# Patient Record
Sex: Female | Born: 1965 | Race: White | Hispanic: No | Marital: Married
Health system: Southern US, Community
[De-identification: ages and names within clinical notes are randomized; demographics above are authoritative.]

---

## 2001-01-19 ENCOUNTER — Encounter: Admission: RE | Admit: 2001-01-19 | Discharge: 2001-01-19 | Payer: Self-pay | Admitting: Urology

## 2001-01-19 ENCOUNTER — Encounter: Payer: Self-pay | Admitting: Urology

## 2001-10-19 ENCOUNTER — Ambulatory Visit (HOSPITAL_BASED_OUTPATIENT_CLINIC_OR_DEPARTMENT_OTHER): Admission: RE | Admit: 2001-10-19 | Discharge: 2001-10-19 | Payer: Self-pay | Admitting: Urology

## 2001-10-19 ENCOUNTER — Encounter (INDEPENDENT_AMBULATORY_CARE_PROVIDER_SITE_OTHER): Payer: Self-pay | Admitting: *Deleted

## 2003-05-23 ENCOUNTER — Other Ambulatory Visit: Admission: RE | Admit: 2003-05-23 | Discharge: 2003-05-23 | Payer: Self-pay | Admitting: Family Medicine

## 2004-03-16 ENCOUNTER — Encounter: Admission: RE | Admit: 2004-03-16 | Discharge: 2004-03-16 | Payer: Self-pay | Admitting: Family Medicine

## 2004-05-26 ENCOUNTER — Other Ambulatory Visit: Admission: RE | Admit: 2004-05-26 | Discharge: 2004-05-26 | Payer: Self-pay | Admitting: Family Medicine

## 2005-03-29 ENCOUNTER — Encounter: Admission: RE | Admit: 2005-03-29 | Discharge: 2005-03-29 | Payer: Self-pay | Admitting: Family Medicine

## 2005-05-27 ENCOUNTER — Other Ambulatory Visit: Admission: RE | Admit: 2005-05-27 | Discharge: 2005-05-27 | Payer: Self-pay | Admitting: Family Medicine

## 2006-03-30 ENCOUNTER — Encounter: Admission: RE | Admit: 2006-03-30 | Discharge: 2006-03-30 | Payer: Self-pay | Admitting: Family Medicine

## 2006-06-06 ENCOUNTER — Other Ambulatory Visit: Admission: RE | Admit: 2006-06-06 | Discharge: 2006-06-06 | Payer: Self-pay | Admitting: Family Medicine

## 2006-07-21 ENCOUNTER — Encounter (INDEPENDENT_AMBULATORY_CARE_PROVIDER_SITE_OTHER): Payer: Self-pay | Admitting: Obstetrics and Gynecology

## 2006-07-21 ENCOUNTER — Ambulatory Visit (HOSPITAL_COMMUNITY): Admission: RE | Admit: 2006-07-21 | Discharge: 2006-07-21 | Payer: Self-pay | Admitting: Obstetrics and Gynecology

## 2007-04-03 ENCOUNTER — Encounter: Admission: RE | Admit: 2007-04-03 | Discharge: 2007-04-03 | Payer: Self-pay | Admitting: Family Medicine

## 2007-06-07 ENCOUNTER — Other Ambulatory Visit: Admission: RE | Admit: 2007-06-07 | Discharge: 2007-06-07 | Payer: Self-pay | Admitting: Family Medicine

## 2008-04-11 ENCOUNTER — Encounter: Admission: RE | Admit: 2008-04-11 | Discharge: 2008-04-11 | Payer: Self-pay | Admitting: Family Medicine

## 2008-06-06 ENCOUNTER — Other Ambulatory Visit: Admission: RE | Admit: 2008-06-06 | Discharge: 2008-06-06 | Payer: Self-pay | Admitting: Family Medicine

## 2009-04-14 ENCOUNTER — Encounter: Admission: RE | Admit: 2009-04-14 | Discharge: 2009-04-14 | Payer: Self-pay | Admitting: Family Medicine

## 2009-06-18 ENCOUNTER — Other Ambulatory Visit: Admission: RE | Admit: 2009-06-18 | Discharge: 2009-06-18 | Payer: Self-pay | Admitting: Family Medicine

## 2010-04-13 ENCOUNTER — Other Ambulatory Visit: Payer: Self-pay | Admitting: Family Medicine

## 2010-04-13 DIAGNOSIS — Z1231 Encounter for screening mammogram for malignant neoplasm of breast: Secondary | ICD-10-CM

## 2010-04-20 ENCOUNTER — Ambulatory Visit
Admission: RE | Admit: 2010-04-20 | Discharge: 2010-04-20 | Disposition: A | Discharge status: Home / Self Care | Payer: BC Managed Care – PPO | Source: Ambulatory Visit | Attending: Family Medicine | Admitting: Family Medicine

## 2010-04-20 DIAGNOSIS — Z1231 Encounter for screening mammogram for malignant neoplasm of breast: Secondary | ICD-10-CM

## 2010-06-23 NOTE — Op Note (Signed)
[name], HUMBARGER              ACCOUNT NO.:  [id number]   MEDICAL RECORD NO.:  [id number]          PATIENT TYPE:  AMB   LOCATION:  [facility]                           FACILITY:  [facility]   PHYSICIAN:  Gerald Leitz, MD          DATE OF BIRTH:  12/25/1965   DATE OF PROCEDURE:  07/21/2006  DATE OF DISCHARGE:                               OPERATIVE REPORT   PREOPERATIVE DIAGNOSIS:  1. Desires permanent sterilization.  2. Menorrhagia.  3. Uterine fibroids.   POSTOPERATIVE DIAGNOSIS:  1. Desires permanent sterilization.  2. Menorrhagia.  3. Uterine fibroids.   PROCEDURE:  Dilation and curettage, ThermaChoice endometrial ablation,  and laparoscopic bilateral tubal ligation with Filshie clamps.   SURGEON:  Dr. Gerald Leitz   ASSISTANT:  None.   ANESTHESIA:  General.   SPECIMEN:  Endometrial curettings.   DISPOSITION:  No specimen sent to pathology.   ESTIMATED BLOOD LOSS:  Approximately 10 mL.   COMPLICATIONS:  None.   INDICATIONS:  This is a 45 year old gravida 0 who desires permanent  sterilization.  She also has a history of menorrhagia and desires  endometrial ablation.   PROCEDURE:  Informed consent was obtained and the patient was taken to  the operating room where she was placed under general anesthesia. She  was placed in dorsal lithotomy position.  She was prepped and draped in  the usual sterile fashion. In-and-out catheterization was performed with  200 mL of urine obtained.  Bivalve speculum placed into the vaginal  vault.  The anterior lip of the cervix was grasped with a single-tooth  tenaculum.  The cervix sounded to 8 cm. The cervix dilated to #12  dilator.  Sharp curettage was performed all way around until a gritty  texture was noted. Endometrial curettings were sent to pathology.  ThermaChoice ablation was performed.  The apparatus was inserted to a  depth of 8 mm, approximately 10 mL of D5W was inserted into the  ThermaChoice balloon until pressure was obtained  greater than 160.  ThermaChoice ablation proceeded. This ablation discontinued due to  overheating to 90 degrees Celsius. After 6 minutes of therapy the  apparatus was allowed to cool and the fluid of D5W was removed from the  balloon and the ThermaChoice apparatus was removed from the cervix. The  single-tooth tenaculum was removed from the anterior lip of the cervix.  Uterine manipulator was then placed.  The attention was turned to the  abdomen where a 5 mm incision was made at the umbilicus. 5 mm Excel  trocar was placed under direct visualization. Pneumoperitoneum was  obtained with CO2 gas.  The abdomen and pelvis were examined.  The  patient was noted to have numerous pedunculated fibroids and ovaries  appeared normal bilaterally. The fallopian tube on the right had a  clubbed dilated appearance.  10 mm incision was made 2 cm above the  pubic symphysis, 10 mm Excel trocar was introduced under direct  visualization. Filshie clip apparatus was inserted and Filshie clips  were placed on the right and left fallopian tube. There was some slight  oozing  from a pedunculated fibroid in the left adnexa. Suction  irrigation was performed.  This was then found to be hemostatic. 10 mm  trocar was removed from the abdomen under direct visualization.  CO2  pneumoperitoneum was released and the umbilical trocar was removed under  direct visualization. The 10 mm incision in the fascia was closed with 0  Vicryl.  Skin was closed with 4-0 Vicryl.  The umbilical skin incision  was closed with 4-0 Vicryl.  Sponge, lap, needle counts were correct x2.  The uterine manipulator was removed from the vagina.   FINDINGS:  Multiple uterine fibroids, small pedunculated fibroids, as  well as a larger a pedunculated fibroid in the left adnexa. Ovaries  appeared normal bilaterally.  The right fallopian tube was somewhat  dilated with clubbed appearance.      Gerald Leitz, MD  Electronically Signed      TC/MEDQ  D:  07/21/2006  T:  07/21/2006  Job:  [id number]

## 2010-06-23 NOTE — H&P (Signed)
NAMESHARANDA, [name]              ACCOUNT NO.:  [id number]   MEDICAL RECORD NO.:  [id number]          PATIENT TYPE:  AMB   LOCATION:  [facility]                           FACILITY:  [facility]   PHYSICIAN:  Gerald Leitz, MD          DATE OF BIRTH:  August 22, 1965   DATE OF ADMISSION:  DATE OF DISCHARGE:                              HISTORY & PHYSICAL   HISTORY OF PRESENT ILLNESS:  This is a 45 year old G0 who desires  permanent sterilization. She also has history of menorrhagia and desires  endometrial ablation. She has previously been on oral contraceptives for  control of her menorrhagia as well as contraception. However, this is  now contraindicated given her age and concomitant cigarette use.   PAST OB HISTORY:  Negative.   PAST GYN HISTORY:  History HPV and abnormal Pap smears. Last Pap smear  was performed 06/06/06, was normal. History of laser surgery of her  cervix for dysplasia several years ago.   PAST MEDICAL HISTORY:  Significant for interstitial cystitis and  obesity.   PAST SURGICAL HISTORY:  Tonsillectomy. Bladder biopsy. Laser surgery of  the cervix.   MEDICATIONS:  1. Xanax 0.25 mg half a pill daily at bedtime.  2. Elmiron 100 mg three times a day. This was stopped before surgery.   ALLERGIES:  AMOXICILLIN WHICH SHE STATES CAUSES A RASH.   SOCIAL HISTORY:  The patient is married. Smokes 1 pack of cigarettes per  day. Approximately 2 beers per week. No illicit drug use.   FAMILY HISTORY:  Negative for breast, ovarian, colon cancer.   REVIEW OF SYSTEMS:  Negative except for stated in history of current  illness.   PHYSICAL EXAMINATION:  VITAL SIGNS: Blood pressure 140/90, weight 209  pounds.  CARDIOVASCULAR: Regular rate and rhythm.  LUNGS: Clear to auscultation bilaterally.  ABDOMEN: The abdomen is soft, nontender, nondistended. Positive bowel  sounds.  EXTREMITIES: No clubbing, cyanosis or edema.  PELVIC: On exam, normal external female genitalia. No vulvar or vaginal  cervical lesions noted. Bimanual exam reveals a normal-sized uterus.   LABORATORY DATA:  Ultrasound performed 06/28/06, shows the uterus to  measure 8.09 cm in length, AP diameter is 4 cm with 8.8 cm a  strangulated posterior left fibroid that was 7.3 x 6.9 x 5.9 cm.  Endometrium appeared thin. Right ovary appeared normal. Left ovary could  not be visualized due to the fibroid.   ASSESSMENT AND PLAN:  The patient is a 45 year old who desires permanent  sterilization. She also has menorrhagia and a strangulated. She desires  endometrial ablation. The risks, benefits, alternatives of the surgery  were discussed with the patient including, but not limited to:  Infection, bleeding, damage to bowel, bladder, surrounding organs with a  need for further surgery, uterine manipulation. The patient understands  all risks and desires to proceed with bilateral tubal ligation and  endometrial ablation. Also discussed endometrial biopsy necessary prior  to ablation. The patient opted not to have this done in the office. She  was made aware of the risk that the possibility of cancer could exist  and if this is found on dilation and curettage she may require further  surgery. We also discussed the 1% risk of failure or tubal ligation with  50% risk of ectopic pregnancy if failure occurs, which could be life-  threatening.   The patient desires to proceed with laparoscopic bilateral tubal  ligation as well as dilation and curettage and endometrial ablation.      Gerald Leitz, MD  Electronically Signed     TC/MEDQ  D:  07/20/2006  T:  07/20/2006  Job:  [id number]

## 2010-06-24 ENCOUNTER — Other Ambulatory Visit: Payer: Self-pay | Admitting: Family Medicine

## 2010-06-24 ENCOUNTER — Other Ambulatory Visit (HOSPITAL_COMMUNITY)
Admission: RE | Admit: 2010-06-24 | Discharge: 2010-06-24 | Disposition: A | Discharge status: Home / Self Care | Payer: BC Managed Care – PPO | Source: Ambulatory Visit | Attending: Family Medicine | Admitting: Family Medicine

## 2010-06-24 DIAGNOSIS — Z124 Encounter for screening for malignant neoplasm of cervix: Secondary | ICD-10-CM | POA: Insufficient documentation

## 2010-06-26 NOTE — Op Note (Signed)
   [name], FRIEL                       ACCOUNT NO.:  [id number]   MEDICAL RECORD NO.:  [id number]                   PATIENT TYPE:  AMB   LOCATION:  [facility]                                 FACILITY:  [facility]   PHYSICIAN:  Sigmund I. Patsi Sears, M.D.         DATE OF BIRTH:  February 15, 1965   DATE OF PROCEDURE:  DATE OF DISCHARGE:                                 OPERATIVE REPORT   PREOPERATIVE DIAGNOSES:  Interstitial cystitis.   POSTOPERATIVE DIAGNOSES:  Interstitial cystitis.   OPERATION:  Cystourethroscopy, hydrodistention of the bladder (500 cc  bladder capacity), cold cup bladder biopsy with cauterization of biopsy  sites, and insertion of Pyridium and Marcaine, and injection Marcaine and  Kenalog in the subtrigonal space.   SURGEON:  Sigmund I. Patsi Sears, M.D.   ANESTHESIA:  General (LMA).   PREPARATION:  After appropriate preanesthesia, the patient was brought to  the operating room, placed on the operating table in the dorsal supine  position where general LMA anesthesia was introduced. She was then replaced  in the dorsal lithotomy position where the pubis was prepped with Betadine  solution and draped in the usual fashion.   DESCRIPTION OF PROCEDURE:  Cystourethroscopy and hydrodistention was  accomplished. The patient had definite urethral stenosis and this was  dilated. Hydrodistention was accomplished to 500 cc only. Cystourethroscopy  revealed multiple areas of bladder microhemorrhages, and two areas of  biopsies were taken, sent to the laboratory. The areas were cauterized with  the electrosurgical unit. Marcaine and Kenalog was injected into the  subtrigonal space and Marcaine and Pyridium was inserted into the bladder.  The patient was given IV Toradol, as well as covered with IV antibiotics,  and awakened and taken to the recovery room in good condition.                                                Sigmund I. Patsi Sears, M.D.    SIT/MEDQ  D:  10/19/2001   T:  10/19/2001  Job:  [id number]

## 2010-11-26 LAB — CBC
Hemoglobin: 13.9
RBC: 4.65
RDW: 12.4
WBC: 8.7

## 2010-11-26 LAB — SAMPLE TO BLOOD BANK

## 2010-11-26 LAB — PREGNANCY, URINE: Preg Test, Ur: NEGATIVE

## 2011-03-24 ENCOUNTER — Other Ambulatory Visit: Payer: Self-pay | Admitting: Family Medicine

## 2011-03-24 DIAGNOSIS — Z1231 Encounter for screening mammogram for malignant neoplasm of breast: Secondary | ICD-10-CM

## 2011-04-22 ENCOUNTER — Ambulatory Visit
Admission: RE | Admit: 2011-04-22 | Discharge: 2011-04-22 | Disposition: A | Discharge status: Home / Self Care | Payer: BC Managed Care – PPO | Source: Ambulatory Visit | Attending: Family Medicine | Admitting: Family Medicine

## 2011-04-22 DIAGNOSIS — Z1231 Encounter for screening mammogram for malignant neoplasm of breast: Secondary | ICD-10-CM

## 2011-06-30 ENCOUNTER — Other Ambulatory Visit: Payer: Self-pay | Admitting: Family Medicine

## 2011-07-15 ENCOUNTER — Other Ambulatory Visit: Payer: Self-pay | Admitting: Dermatology

## 2012-03-15 ENCOUNTER — Other Ambulatory Visit: Payer: Self-pay | Admitting: Family Medicine

## 2012-03-15 DIAGNOSIS — Z1231 Encounter for screening mammogram for malignant neoplasm of breast: Secondary | ICD-10-CM

## 2012-04-25 ENCOUNTER — Ambulatory Visit: Payer: BC Managed Care – PPO

## 2012-04-25 ENCOUNTER — Ambulatory Visit
Admission: RE | Admit: 2012-04-25 | Discharge: 2012-04-25 | Disposition: A | Discharge status: Home / Self Care | Payer: BC Managed Care – PPO | Source: Ambulatory Visit | Attending: Family Medicine | Admitting: Family Medicine

## 2012-04-25 DIAGNOSIS — Z1231 Encounter for screening mammogram for malignant neoplasm of breast: Secondary | ICD-10-CM

## 2012-07-12 ENCOUNTER — Other Ambulatory Visit (HOSPITAL_COMMUNITY)
Admission: RE | Admit: 2012-07-12 | Discharge: 2012-07-12 | Disposition: A | Discharge status: Home / Self Care | Payer: BC Managed Care – PPO | Source: Ambulatory Visit | Attending: Family Medicine | Admitting: Family Medicine

## 2012-07-12 ENCOUNTER — Other Ambulatory Visit: Payer: Self-pay | Admitting: Family Medicine

## 2012-07-12 DIAGNOSIS — Z124 Encounter for screening for malignant neoplasm of cervix: Secondary | ICD-10-CM | POA: Insufficient documentation

## 2012-10-25 ENCOUNTER — Other Ambulatory Visit: Payer: Self-pay | Admitting: Dermatology

## 2013-03-20 ENCOUNTER — Other Ambulatory Visit: Payer: Self-pay

## 2013-03-20 DIAGNOSIS — Z1231 Encounter for screening mammogram for malignant neoplasm of breast: Secondary | ICD-10-CM

## 2013-05-01 ENCOUNTER — Ambulatory Visit
Admission: RE | Admit: 2013-05-01 | Discharge: 2013-05-01 | Disposition: A | Discharge status: Home / Self Care | Payer: BC Managed Care – PPO | Source: Ambulatory Visit

## 2013-05-01 DIAGNOSIS — Z1231 Encounter for screening mammogram for malignant neoplasm of breast: Secondary | ICD-10-CM

## 2014-04-11 ENCOUNTER — Other Ambulatory Visit: Payer: Self-pay

## 2014-04-11 DIAGNOSIS — Z1231 Encounter for screening mammogram for malignant neoplasm of breast: Secondary | ICD-10-CM

## 2014-05-13 ENCOUNTER — Ambulatory Visit: Payer: Self-pay

## 2014-05-15 ENCOUNTER — Ambulatory Visit
Admission: RE | Admit: 2014-05-15 | Discharge: 2014-05-15 | Disposition: A | Discharge status: Home / Self Care | Payer: BLUE CROSS/BLUE SHIELD | Source: Ambulatory Visit

## 2014-05-15 DIAGNOSIS — Z1231 Encounter for screening mammogram for malignant neoplasm of breast: Secondary | ICD-10-CM

## 2014-09-25 ENCOUNTER — Other Ambulatory Visit: Payer: Self-pay | Admitting: Gastroenterology

## 2015-04-17 ENCOUNTER — Other Ambulatory Visit: Payer: Self-pay

## 2015-04-17 DIAGNOSIS — Z1231 Encounter for screening mammogram for malignant neoplasm of breast: Secondary | ICD-10-CM

## 2015-05-16 ENCOUNTER — Ambulatory Visit: Payer: BLUE CROSS/BLUE SHIELD

## 2015-05-16 ENCOUNTER — Ambulatory Visit
Admission: RE | Admit: 2015-05-16 | Discharge: 2015-05-16 | Disposition: A | Discharge status: Home / Self Care | Payer: BLUE CROSS/BLUE SHIELD | Source: Ambulatory Visit

## 2015-05-16 DIAGNOSIS — Z1231 Encounter for screening mammogram for malignant neoplasm of breast: Secondary | ICD-10-CM | POA: Diagnosis not present

## 2015-06-17 DIAGNOSIS — D1801 Hemangioma of skin and subcutaneous tissue: Secondary | ICD-10-CM | POA: Diagnosis not present

## 2015-06-17 DIAGNOSIS — L814 Other melanin hyperpigmentation: Secondary | ICD-10-CM | POA: Diagnosis not present

## 2015-06-17 DIAGNOSIS — L820 Inflamed seborrheic keratosis: Secondary | ICD-10-CM | POA: Diagnosis not present

## 2015-06-17 DIAGNOSIS — D235 Other benign neoplasm of skin of trunk: Secondary | ICD-10-CM | POA: Diagnosis not present

## 2015-07-18 ENCOUNTER — Other Ambulatory Visit (HOSPITAL_COMMUNITY)
Admission: RE | Admit: 2015-07-18 | Discharge: 2015-07-18 | Disposition: A | Discharge status: Home / Self Care | Payer: BLUE CROSS/BLUE SHIELD | Source: Ambulatory Visit | Attending: Family Medicine | Admitting: Family Medicine

## 2015-07-18 ENCOUNTER — Other Ambulatory Visit: Payer: Self-pay | Admitting: Family Medicine

## 2015-07-18 DIAGNOSIS — I10 Essential (primary) hypertension: Secondary | ICD-10-CM | POA: Diagnosis not present

## 2015-07-18 DIAGNOSIS — E039 Hypothyroidism, unspecified: Secondary | ICD-10-CM | POA: Diagnosis not present

## 2015-07-18 DIAGNOSIS — Z124 Encounter for screening for malignant neoplasm of cervix: Secondary | ICD-10-CM | POA: Diagnosis not present

## 2015-07-18 DIAGNOSIS — Z0000 Encounter for general adult medical examination without abnormal findings: Secondary | ICD-10-CM | POA: Diagnosis not present

## 2015-07-18 DIAGNOSIS — G2581 Restless legs syndrome: Secondary | ICD-10-CM | POA: Diagnosis not present

## 2015-07-18 DIAGNOSIS — N943 Premenstrual tension syndrome: Secondary | ICD-10-CM | POA: Diagnosis not present

## 2015-07-18 DIAGNOSIS — F419 Anxiety disorder, unspecified: Secondary | ICD-10-CM | POA: Diagnosis not present

## 2015-07-22 LAB — CYTOLOGY - PAP

## 2015-10-29 DIAGNOSIS — J45909 Unspecified asthma, uncomplicated: Secondary | ICD-10-CM | POA: Diagnosis not present

## 2015-12-16 DIAGNOSIS — D225 Melanocytic nevi of trunk: Secondary | ICD-10-CM | POA: Diagnosis not present

## 2015-12-16 DIAGNOSIS — B079 Viral wart, unspecified: Secondary | ICD-10-CM | POA: Diagnosis not present

## 2015-12-16 DIAGNOSIS — Z85820 Personal history of malignant melanoma of skin: Secondary | ICD-10-CM | POA: Diagnosis not present

## 2015-12-16 DIAGNOSIS — L821 Other seborrheic keratosis: Secondary | ICD-10-CM | POA: Diagnosis not present

## 2015-12-16 DIAGNOSIS — L814 Other melanin hyperpigmentation: Secondary | ICD-10-CM | POA: Diagnosis not present

## 2016-01-09 DIAGNOSIS — R0602 Shortness of breath: Secondary | ICD-10-CM | POA: Diagnosis not present

## 2016-01-26 DIAGNOSIS — J309 Allergic rhinitis, unspecified: Secondary | ICD-10-CM | POA: Diagnosis not present

## 2016-01-26 DIAGNOSIS — N943 Premenstrual tension syndrome: Secondary | ICD-10-CM | POA: Diagnosis not present

## 2016-01-26 DIAGNOSIS — I10 Essential (primary) hypertension: Secondary | ICD-10-CM | POA: Diagnosis not present

## 2016-01-26 DIAGNOSIS — E039 Hypothyroidism, unspecified: Secondary | ICD-10-CM | POA: Diagnosis not present

## 2016-04-21 ENCOUNTER — Other Ambulatory Visit: Payer: Self-pay | Admitting: Family Medicine

## 2016-04-21 DIAGNOSIS — Z1231 Encounter for screening mammogram for malignant neoplasm of breast: Secondary | ICD-10-CM

## 2016-05-03 DIAGNOSIS — H8110 Benign paroxysmal vertigo, unspecified ear: Secondary | ICD-10-CM | POA: Diagnosis not present

## 2016-05-03 DIAGNOSIS — N938 Other specified abnormal uterine and vaginal bleeding: Secondary | ICD-10-CM | POA: Diagnosis not present

## 2016-05-19 ENCOUNTER — Ambulatory Visit: Payer: BLUE CROSS/BLUE SHIELD

## 2016-06-03 ENCOUNTER — Ambulatory Visit
Admission: RE | Admit: 2016-06-03 | Discharge: 2016-06-03 | Disposition: A | Discharge status: Home / Self Care | Payer: BLUE CROSS/BLUE SHIELD | Source: Ambulatory Visit | Attending: Family Medicine | Admitting: Family Medicine

## 2016-06-03 DIAGNOSIS — Z1231 Encounter for screening mammogram for malignant neoplasm of breast: Secondary | ICD-10-CM

## 2016-06-28 DIAGNOSIS — L814 Other melanin hyperpigmentation: Secondary | ICD-10-CM | POA: Diagnosis not present

## 2016-06-28 DIAGNOSIS — L821 Other seborrheic keratosis: Secondary | ICD-10-CM | POA: Diagnosis not present

## 2016-06-28 DIAGNOSIS — D225 Melanocytic nevi of trunk: Secondary | ICD-10-CM | POA: Diagnosis not present

## 2016-06-28 DIAGNOSIS — L309 Dermatitis, unspecified: Secondary | ICD-10-CM | POA: Diagnosis not present

## 2016-07-27 DIAGNOSIS — J309 Allergic rhinitis, unspecified: Secondary | ICD-10-CM | POA: Diagnosis not present

## 2016-07-27 DIAGNOSIS — I10 Essential (primary) hypertension: Secondary | ICD-10-CM | POA: Diagnosis not present

## 2016-07-27 DIAGNOSIS — N943 Premenstrual tension syndrome: Secondary | ICD-10-CM | POA: Diagnosis not present

## 2016-07-27 DIAGNOSIS — E039 Hypothyroidism, unspecified: Secondary | ICD-10-CM | POA: Diagnosis not present

## 2016-12-07 DIAGNOSIS — W57XXXA Bitten or stung by nonvenomous insect and other nonvenomous arthropods, initial encounter: Secondary | ICD-10-CM | POA: Diagnosis not present

## 2016-12-07 DIAGNOSIS — S70261A Insect bite (nonvenomous), right hip, initial encounter: Secondary | ICD-10-CM | POA: Diagnosis not present

## 2017-02-07 DIAGNOSIS — N943 Premenstrual tension syndrome: Secondary | ICD-10-CM | POA: Diagnosis not present

## 2017-02-07 DIAGNOSIS — E039 Hypothyroidism, unspecified: Secondary | ICD-10-CM | POA: Diagnosis not present

## 2017-02-07 DIAGNOSIS — I10 Essential (primary) hypertension: Secondary | ICD-10-CM | POA: Diagnosis not present

## 2017-02-07 DIAGNOSIS — J309 Allergic rhinitis, unspecified: Secondary | ICD-10-CM | POA: Diagnosis not present

## 2017-05-17 ENCOUNTER — Other Ambulatory Visit: Payer: Self-pay | Admitting: Family Medicine

## 2017-05-17 DIAGNOSIS — Z1231 Encounter for screening mammogram for malignant neoplasm of breast: Secondary | ICD-10-CM

## 2017-05-27 DIAGNOSIS — Z0000 Encounter for general adult medical examination without abnormal findings: Secondary | ICD-10-CM | POA: Diagnosis not present

## 2017-06-08 ENCOUNTER — Ambulatory Visit
Admission: RE | Admit: 2017-06-08 | Discharge: 2017-06-08 | Disposition: A | Discharge status: Home / Self Care | Payer: BLUE CROSS/BLUE SHIELD | Source: Ambulatory Visit | Attending: Family Medicine | Admitting: Family Medicine

## 2017-06-08 DIAGNOSIS — Z1231 Encounter for screening mammogram for malignant neoplasm of breast: Secondary | ICD-10-CM

## 2017-06-28 DIAGNOSIS — L819 Disorder of pigmentation, unspecified: Secondary | ICD-10-CM | POA: Diagnosis not present

## 2017-06-28 DIAGNOSIS — L814 Other melanin hyperpigmentation: Secondary | ICD-10-CM | POA: Diagnosis not present

## 2017-06-28 DIAGNOSIS — D1801 Hemangioma of skin and subcutaneous tissue: Secondary | ICD-10-CM | POA: Diagnosis not present

## 2017-06-28 DIAGNOSIS — L821 Other seborrheic keratosis: Secondary | ICD-10-CM | POA: Diagnosis not present

## 2017-08-04 DIAGNOSIS — I10 Essential (primary) hypertension: Secondary | ICD-10-CM | POA: Diagnosis not present

## 2017-08-04 DIAGNOSIS — J309 Allergic rhinitis, unspecified: Secondary | ICD-10-CM | POA: Diagnosis not present

## 2017-08-04 DIAGNOSIS — E039 Hypothyroidism, unspecified: Secondary | ICD-10-CM | POA: Diagnosis not present

## 2017-08-04 DIAGNOSIS — N943 Premenstrual tension syndrome: Secondary | ICD-10-CM | POA: Diagnosis not present

## 2017-10-15 DIAGNOSIS — Z23 Encounter for immunization: Secondary | ICD-10-CM | POA: Diagnosis not present

## 2018-02-27 DIAGNOSIS — E039 Hypothyroidism, unspecified: Secondary | ICD-10-CM | POA: Diagnosis not present

## 2018-02-27 DIAGNOSIS — I10 Essential (primary) hypertension: Secondary | ICD-10-CM | POA: Diagnosis not present

## 2018-02-27 DIAGNOSIS — Z1159 Encounter for screening for other viral diseases: Secondary | ICD-10-CM | POA: Diagnosis not present

## 2018-02-27 DIAGNOSIS — J309 Allergic rhinitis, unspecified: Secondary | ICD-10-CM | POA: Diagnosis not present

## 2018-02-27 DIAGNOSIS — N943 Premenstrual tension syndrome: Secondary | ICD-10-CM | POA: Diagnosis not present

## 2018-07-18 ENCOUNTER — Other Ambulatory Visit: Payer: Self-pay | Admitting: Family Medicine

## 2018-07-18 DIAGNOSIS — Z1231 Encounter for screening mammogram for malignant neoplasm of breast: Secondary | ICD-10-CM

## 2018-08-01 DIAGNOSIS — D229 Melanocytic nevi, unspecified: Secondary | ICD-10-CM | POA: Diagnosis not present

## 2018-08-01 DIAGNOSIS — L821 Other seborrheic keratosis: Secondary | ICD-10-CM | POA: Diagnosis not present

## 2018-08-01 DIAGNOSIS — D1801 Hemangioma of skin and subcutaneous tissue: Secondary | ICD-10-CM | POA: Diagnosis not present

## 2018-08-01 DIAGNOSIS — L814 Other melanin hyperpigmentation: Secondary | ICD-10-CM | POA: Diagnosis not present

## 2018-08-28 DIAGNOSIS — I10 Essential (primary) hypertension: Secondary | ICD-10-CM | POA: Diagnosis not present

## 2018-08-28 DIAGNOSIS — E039 Hypothyroidism, unspecified: Secondary | ICD-10-CM | POA: Diagnosis not present

## 2018-08-28 DIAGNOSIS — J309 Allergic rhinitis, unspecified: Secondary | ICD-10-CM | POA: Diagnosis not present

## 2018-08-28 DIAGNOSIS — N943 Premenstrual tension syndrome: Secondary | ICD-10-CM | POA: Diagnosis not present

## 2018-09-04 ENCOUNTER — Other Ambulatory Visit: Payer: Self-pay

## 2018-09-04 ENCOUNTER — Ambulatory Visit
Admission: RE | Admit: 2018-09-04 | Discharge: 2018-09-04 | Disposition: A | Discharge status: Home / Self Care | Payer: BLUE CROSS/BLUE SHIELD | Source: Ambulatory Visit | Attending: Family Medicine | Admitting: Family Medicine

## 2018-09-04 DIAGNOSIS — Z1231 Encounter for screening mammogram for malignant neoplasm of breast: Secondary | ICD-10-CM

## 2018-09-04 DIAGNOSIS — I10 Essential (primary) hypertension: Secondary | ICD-10-CM | POA: Diagnosis not present

## 2018-09-22 DIAGNOSIS — R350 Frequency of micturition: Secondary | ICD-10-CM | POA: Diagnosis not present

## 2019-03-02 DIAGNOSIS — I10 Essential (primary) hypertension: Secondary | ICD-10-CM | POA: Diagnosis not present

## 2019-03-02 DIAGNOSIS — J309 Allergic rhinitis, unspecified: Secondary | ICD-10-CM | POA: Diagnosis not present

## 2019-03-02 DIAGNOSIS — E039 Hypothyroidism, unspecified: Secondary | ICD-10-CM | POA: Diagnosis not present

## 2019-03-02 DIAGNOSIS — N943 Premenstrual tension syndrome: Secondary | ICD-10-CM | POA: Diagnosis not present

## 2019-03-06 DIAGNOSIS — I10 Essential (primary) hypertension: Secondary | ICD-10-CM | POA: Diagnosis not present

## 2019-03-06 DIAGNOSIS — E039 Hypothyroidism, unspecified: Secondary | ICD-10-CM | POA: Diagnosis not present

## 2019-04-21 ENCOUNTER — Ambulatory Visit: Payer: Self-pay | Attending: Internal Medicine

## 2019-04-21 DIAGNOSIS — Z23 Encounter for immunization: Secondary | ICD-10-CM

## 2019-04-21 NOTE — Progress Notes (Signed)
   Covid-19 Vaccination Clinic  Name:  [name]    MRN: [id number] DOB: 21-Nov-1965  04/21/2019  [name] was observed post Covid-19 immunization for 15 minutes without incident. She was provided with Vaccine Information Sheet and instruction to access the V-Safe system.   [name] was instructed to call 911 with any severe reactions post vaccine: Marland Kitchen Difficulty breathing  . Swelling of face and throat  . A fast heartbeat  . A bad rash all over body  . Dizziness and weakness   Immunizations Administered    Name Date Dose VIS Date Route   Moderna COVID-19 Vaccine 04/21/2019 11:02 AM 0.5 mL 01/09/2019 Intramuscular   Manufacturer: Moderna   Lot: [id number]   NDC: 78588-502-77

## 2019-05-11 DIAGNOSIS — R300 Dysuria: Secondary | ICD-10-CM | POA: Diagnosis not present

## 2019-05-11 DIAGNOSIS — R350 Frequency of micturition: Secondary | ICD-10-CM | POA: Diagnosis not present

## 2019-05-11 DIAGNOSIS — N390 Urinary tract infection, site not specified: Secondary | ICD-10-CM | POA: Diagnosis not present

## 2019-05-19 ENCOUNTER — Ambulatory Visit: Payer: Self-pay | Attending: Internal Medicine

## 2019-05-19 DIAGNOSIS — Z23 Encounter for immunization: Secondary | ICD-10-CM

## 2019-05-19 NOTE — Progress Notes (Signed)
   Covid-19 Vaccination Clinic  Name:  [name]    MRN: [id number] DOB: [birth year]  05/19/2019  [name] was observed post Covid-19 immunization for 15 minutes without incident. She was provided with Vaccine Information Sheet and instruction to access the V-Safe system.   [name] was instructed to call 911 with any severe reactions post vaccine: Marland Kitchen Difficulty breathing  . Swelling of face and throat  . A fast heartbeat  . A bad rash all over body  . Dizziness and weakness   Immunizations Administered    Name Date Dose VIS Date Route   Moderna COVID-19 Vaccine 05/19/2019  9:22 AM 0.5 mL 01/09/2019 Intramuscular   Manufacturer: Gala Murdoch   Lot: [id number]   NDC: 23762-831-51

## 2019-06-05 ENCOUNTER — Other Ambulatory Visit: Payer: Self-pay | Admitting: Nurse Practitioner

## 2019-06-05 DIAGNOSIS — R5383 Other fatigue: Secondary | ICD-10-CM | POA: Diagnosis not present

## 2019-06-05 DIAGNOSIS — N938 Other specified abnormal uterine and vaginal bleeding: Secondary | ICD-10-CM | POA: Diagnosis not present

## 2019-06-07 ENCOUNTER — Other Ambulatory Visit: Payer: Self-pay | Admitting: Nurse Practitioner

## 2019-06-07 DIAGNOSIS — N938 Other specified abnormal uterine and vaginal bleeding: Secondary | ICD-10-CM

## 2019-06-08 DIAGNOSIS — N938 Other specified abnormal uterine and vaginal bleeding: Secondary | ICD-10-CM | POA: Diagnosis not present

## 2019-06-27 ENCOUNTER — Other Ambulatory Visit: Payer: Self-pay

## 2019-06-28 ENCOUNTER — Ambulatory Visit
Admission: RE | Admit: 2019-06-28 | Discharge: 2019-06-28 | Disposition: A | Discharge status: Home / Self Care | Payer: BC Managed Care – PPO | Source: Ambulatory Visit | Attending: Nurse Practitioner | Admitting: Nurse Practitioner

## 2019-06-28 DIAGNOSIS — D252 Subserosal leiomyoma of uterus: Secondary | ICD-10-CM | POA: Diagnosis not present

## 2019-06-28 DIAGNOSIS — N938 Other specified abnormal uterine and vaginal bleeding: Secondary | ICD-10-CM

## 2019-07-10 DIAGNOSIS — N938 Other specified abnormal uterine and vaginal bleeding: Secondary | ICD-10-CM | POA: Diagnosis not present

## 2019-07-31 DIAGNOSIS — L819 Disorder of pigmentation, unspecified: Secondary | ICD-10-CM | POA: Diagnosis not present

## 2019-07-31 DIAGNOSIS — L905 Scar conditions and fibrosis of skin: Secondary | ICD-10-CM | POA: Diagnosis not present

## 2019-07-31 DIAGNOSIS — L821 Other seborrheic keratosis: Secondary | ICD-10-CM | POA: Diagnosis not present

## 2019-07-31 DIAGNOSIS — L814 Other melanin hyperpigmentation: Secondary | ICD-10-CM | POA: Diagnosis not present

## 2019-08-10 ENCOUNTER — Other Ambulatory Visit: Payer: Self-pay | Admitting: Family Medicine

## 2019-08-10 DIAGNOSIS — Z1231 Encounter for screening mammogram for malignant neoplasm of breast: Secondary | ICD-10-CM

## 2019-08-30 DIAGNOSIS — J309 Allergic rhinitis, unspecified: Secondary | ICD-10-CM | POA: Diagnosis not present

## 2019-08-30 DIAGNOSIS — Z23 Encounter for immunization: Secondary | ICD-10-CM | POA: Diagnosis not present

## 2019-08-30 DIAGNOSIS — I10 Essential (primary) hypertension: Secondary | ICD-10-CM | POA: Diagnosis not present

## 2019-08-30 DIAGNOSIS — E039 Hypothyroidism, unspecified: Secondary | ICD-10-CM | POA: Diagnosis not present

## 2019-08-30 DIAGNOSIS — N943 Premenstrual tension syndrome: Secondary | ICD-10-CM | POA: Diagnosis not present

## 2019-09-05 ENCOUNTER — Ambulatory Visit
Admission: RE | Admit: 2019-09-05 | Discharge: 2019-09-05 | Disposition: A | Discharge status: Home / Self Care | Payer: BC Managed Care – PPO | Source: Ambulatory Visit | Attending: Family Medicine | Admitting: Family Medicine

## 2019-09-05 ENCOUNTER — Other Ambulatory Visit: Payer: Self-pay

## 2019-09-05 DIAGNOSIS — Z1231 Encounter for screening mammogram for malignant neoplasm of breast: Secondary | ICD-10-CM

## 2019-10-04 DIAGNOSIS — N938 Other specified abnormal uterine and vaginal bleeding: Secondary | ICD-10-CM | POA: Diagnosis not present

## 2019-10-04 DIAGNOSIS — D259 Leiomyoma of uterus, unspecified: Secondary | ICD-10-CM | POA: Diagnosis not present

## 2020-03-03 DIAGNOSIS — N943 Premenstrual tension syndrome: Secondary | ICD-10-CM | POA: Diagnosis not present

## 2020-03-03 DIAGNOSIS — I10 Essential (primary) hypertension: Secondary | ICD-10-CM | POA: Diagnosis not present

## 2020-03-03 DIAGNOSIS — J309 Allergic rhinitis, unspecified: Secondary | ICD-10-CM | POA: Diagnosis not present

## 2020-03-03 DIAGNOSIS — E039 Hypothyroidism, unspecified: Secondary | ICD-10-CM | POA: Diagnosis not present

## 2020-03-17 DIAGNOSIS — N939 Abnormal uterine and vaginal bleeding, unspecified: Secondary | ICD-10-CM | POA: Diagnosis not present

## 2020-05-20 DIAGNOSIS — E039 Hypothyroidism, unspecified: Secondary | ICD-10-CM | POA: Diagnosis not present

## 2020-07-30 DIAGNOSIS — I8393 Asymptomatic varicose veins of bilateral lower extremities: Secondary | ICD-10-CM | POA: Diagnosis not present

## 2020-07-30 DIAGNOSIS — L718 Other rosacea: Secondary | ICD-10-CM | POA: Diagnosis not present

## 2020-07-30 DIAGNOSIS — L905 Scar conditions and fibrosis of skin: Secondary | ICD-10-CM | POA: Diagnosis not present

## 2020-07-30 DIAGNOSIS — D229 Melanocytic nevi, unspecified: Secondary | ICD-10-CM | POA: Diagnosis not present

## 2020-08-04 ENCOUNTER — Other Ambulatory Visit: Payer: Self-pay | Admitting: Family Medicine

## 2020-08-04 DIAGNOSIS — Z1231 Encounter for screening mammogram for malignant neoplasm of breast: Secondary | ICD-10-CM

## 2020-09-01 DIAGNOSIS — J309 Allergic rhinitis, unspecified: Secondary | ICD-10-CM | POA: Diagnosis not present

## 2020-09-01 DIAGNOSIS — I10 Essential (primary) hypertension: Secondary | ICD-10-CM | POA: Diagnosis not present

## 2020-09-01 DIAGNOSIS — N943 Premenstrual tension syndrome: Secondary | ICD-10-CM | POA: Diagnosis not present

## 2020-09-01 DIAGNOSIS — N898 Other specified noninflammatory disorders of vagina: Secondary | ICD-10-CM | POA: Diagnosis not present

## 2020-09-01 DIAGNOSIS — E039 Hypothyroidism, unspecified: Secondary | ICD-10-CM | POA: Diagnosis not present

## 2020-09-26 ENCOUNTER — Ambulatory Visit
Admission: RE | Admit: 2020-09-26 | Discharge: 2020-09-26 | Disposition: A | Discharge status: Home / Self Care | Payer: BC Managed Care – PPO | Source: Ambulatory Visit | Attending: Family Medicine | Admitting: Family Medicine

## 2020-09-26 ENCOUNTER — Other Ambulatory Visit: Payer: Self-pay

## 2020-09-26 DIAGNOSIS — Z1231 Encounter for screening mammogram for malignant neoplasm of breast: Secondary | ICD-10-CM

## 2020-09-29 DIAGNOSIS — Z86010 Personal history of colonic polyps: Secondary | ICD-10-CM | POA: Diagnosis not present

## 2020-10-06 DIAGNOSIS — N939 Abnormal uterine and vaginal bleeding, unspecified: Secondary | ICD-10-CM | POA: Diagnosis not present

## 2020-10-06 DIAGNOSIS — N951 Menopausal and female climacteric states: Secondary | ICD-10-CM | POA: Diagnosis not present

## 2020-10-28 DIAGNOSIS — E039 Hypothyroidism, unspecified: Secondary | ICD-10-CM | POA: Diagnosis not present

## 2021-03-04 DIAGNOSIS — N943 Premenstrual tension syndrome: Secondary | ICD-10-CM | POA: Diagnosis not present

## 2021-03-04 DIAGNOSIS — J309 Allergic rhinitis, unspecified: Secondary | ICD-10-CM | POA: Diagnosis not present

## 2021-03-04 DIAGNOSIS — E039 Hypothyroidism, unspecified: Secondary | ICD-10-CM | POA: Diagnosis not present

## 2021-03-04 DIAGNOSIS — I10 Essential (primary) hypertension: Secondary | ICD-10-CM | POA: Diagnosis not present

## 2021-03-26 DIAGNOSIS — R7303 Prediabetes: Secondary | ICD-10-CM | POA: Diagnosis not present

## 2021-04-23 DIAGNOSIS — R7303 Prediabetes: Secondary | ICD-10-CM | POA: Diagnosis not present

## 2021-05-24 DIAGNOSIS — R7303 Prediabetes: Secondary | ICD-10-CM | POA: Diagnosis not present

## 2021-06-23 DIAGNOSIS — R7303 Prediabetes: Secondary | ICD-10-CM | POA: Diagnosis not present

## 2021-06-30 DIAGNOSIS — N951 Menopausal and female climacteric states: Secondary | ICD-10-CM | POA: Diagnosis not present

## 2021-06-30 DIAGNOSIS — Z01419 Encounter for gynecological examination (general) (routine) without abnormal findings: Secondary | ICD-10-CM | POA: Diagnosis not present

## 2021-07-21 DIAGNOSIS — L570 Actinic keratosis: Secondary | ICD-10-CM | POA: Diagnosis not present

## 2021-07-21 DIAGNOSIS — L821 Other seborrheic keratosis: Secondary | ICD-10-CM | POA: Diagnosis not present

## 2021-07-21 DIAGNOSIS — D492 Neoplasm of unspecified behavior of bone, soft tissue, and skin: Secondary | ICD-10-CM | POA: Diagnosis not present

## 2021-07-21 DIAGNOSIS — L718 Other rosacea: Secondary | ICD-10-CM | POA: Diagnosis not present

## 2021-07-21 DIAGNOSIS — L814 Other melanin hyperpigmentation: Secondary | ICD-10-CM | POA: Diagnosis not present

## 2021-07-21 DIAGNOSIS — D2261 Melanocytic nevi of right upper limb, including shoulder: Secondary | ICD-10-CM | POA: Diagnosis not present

## 2021-07-21 DIAGNOSIS — D225 Melanocytic nevi of trunk: Secondary | ICD-10-CM | POA: Diagnosis not present

## 2021-07-24 DIAGNOSIS — R7303 Prediabetes: Secondary | ICD-10-CM | POA: Diagnosis not present

## 2021-08-20 ENCOUNTER — Other Ambulatory Visit: Payer: Self-pay | Admitting: Family Medicine

## 2021-08-20 DIAGNOSIS — Z1231 Encounter for screening mammogram for malignant neoplasm of breast: Secondary | ICD-10-CM

## 2021-08-23 DIAGNOSIS — R7303 Prediabetes: Secondary | ICD-10-CM | POA: Diagnosis not present

## 2021-09-01 DIAGNOSIS — I10 Essential (primary) hypertension: Secondary | ICD-10-CM | POA: Diagnosis not present

## 2021-09-01 DIAGNOSIS — J309 Allergic rhinitis, unspecified: Secondary | ICD-10-CM | POA: Diagnosis not present

## 2021-09-01 DIAGNOSIS — N943 Premenstrual tension syndrome: Secondary | ICD-10-CM | POA: Diagnosis not present

## 2021-09-01 DIAGNOSIS — E039 Hypothyroidism, unspecified: Secondary | ICD-10-CM | POA: Diagnosis not present

## 2021-09-23 DIAGNOSIS — R7303 Prediabetes: Secondary | ICD-10-CM | POA: Diagnosis not present

## 2021-09-30 ENCOUNTER — Ambulatory Visit: Payer: BC Managed Care – PPO

## 2021-10-21 ENCOUNTER — Ambulatory Visit
Admission: RE | Admit: 2021-10-21 | Discharge: 2021-10-21 | Disposition: A | Discharge status: Home / Self Care | Payer: BC Managed Care – PPO | Source: Ambulatory Visit | Attending: Family Medicine | Admitting: Family Medicine

## 2021-10-21 DIAGNOSIS — Z1231 Encounter for screening mammogram for malignant neoplasm of breast: Secondary | ICD-10-CM | POA: Diagnosis not present

## 2021-10-24 DIAGNOSIS — R7303 Prediabetes: Secondary | ICD-10-CM | POA: Diagnosis not present

## 2021-11-23 DIAGNOSIS — R7303 Prediabetes: Secondary | ICD-10-CM | POA: Diagnosis not present

## 2022-01-07 DIAGNOSIS — E039 Hypothyroidism, unspecified: Secondary | ICD-10-CM | POA: Diagnosis not present

## 2022-09-13 ENCOUNTER — Other Ambulatory Visit: Payer: Self-pay | Admitting: Family Medicine

## 2022-09-13 DIAGNOSIS — Z0000 Encounter for general adult medical examination without abnormal findings: Secondary | ICD-10-CM

## 2022-11-02 ENCOUNTER — Ambulatory Visit
Admission: RE | Admit: 2022-11-02 | Discharge: 2022-11-02 | Disposition: A | Discharge status: Home / Self Care | Payer: No Typology Code available for payment source | Source: Ambulatory Visit | Attending: Family Medicine | Admitting: Family Medicine

## 2022-11-02 DIAGNOSIS — Z0000 Encounter for general adult medical examination without abnormal findings: Secondary | ICD-10-CM

## 2023-08-10 IMAGING — MG MM DIGITAL SCREENING BILAT W/ TOMO AND CAD
6 of 10 series · 6 of 30 positions shown · non-contrast
Comparison: Previous exam(s).

CLINICAL DATA: Screening.

EXAM:
DIGITAL SCREENING BILATERAL MAMMOGRAM WITH TOMOSYNTHESIS AND CAD
TECHNIQUE: Bilateral screening digital craniocaudal and mediolateral oblique
mammograms were obtained. Bilateral screening digital breast
tomosynthesis was performed. The images were evaluated with
computer-aided detection.

[R MLO synth-2D (1 of 2)]
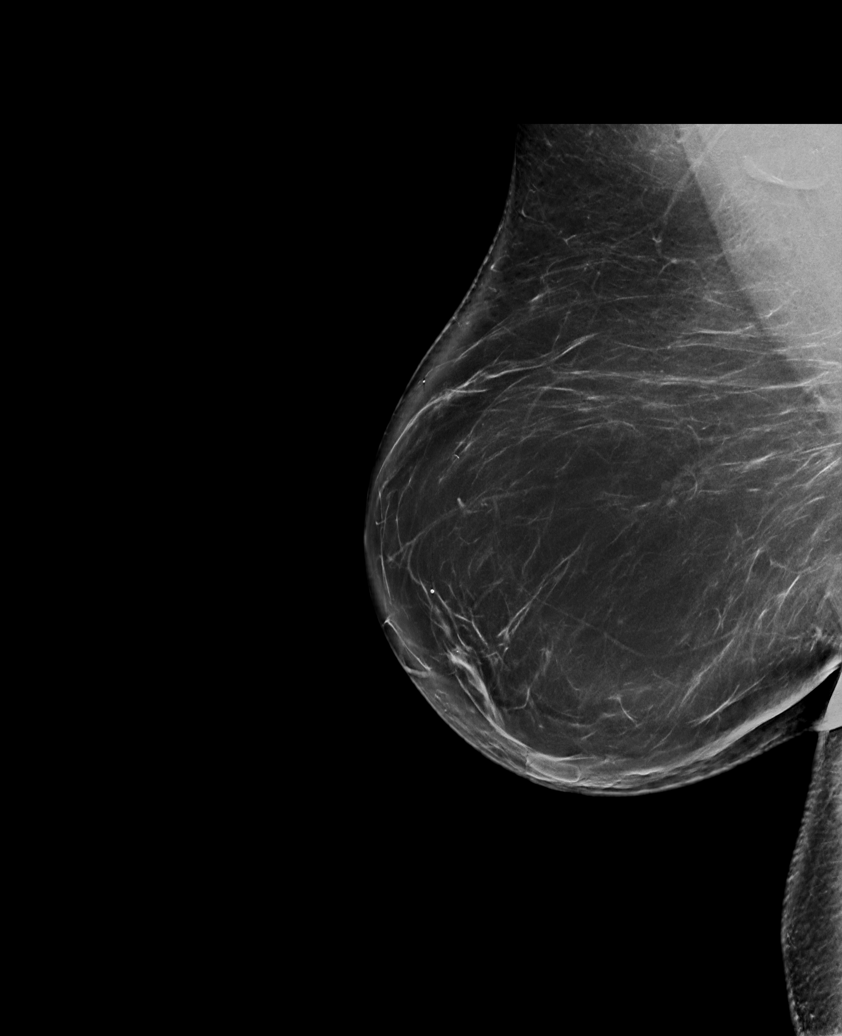

[R CC synth-2D]
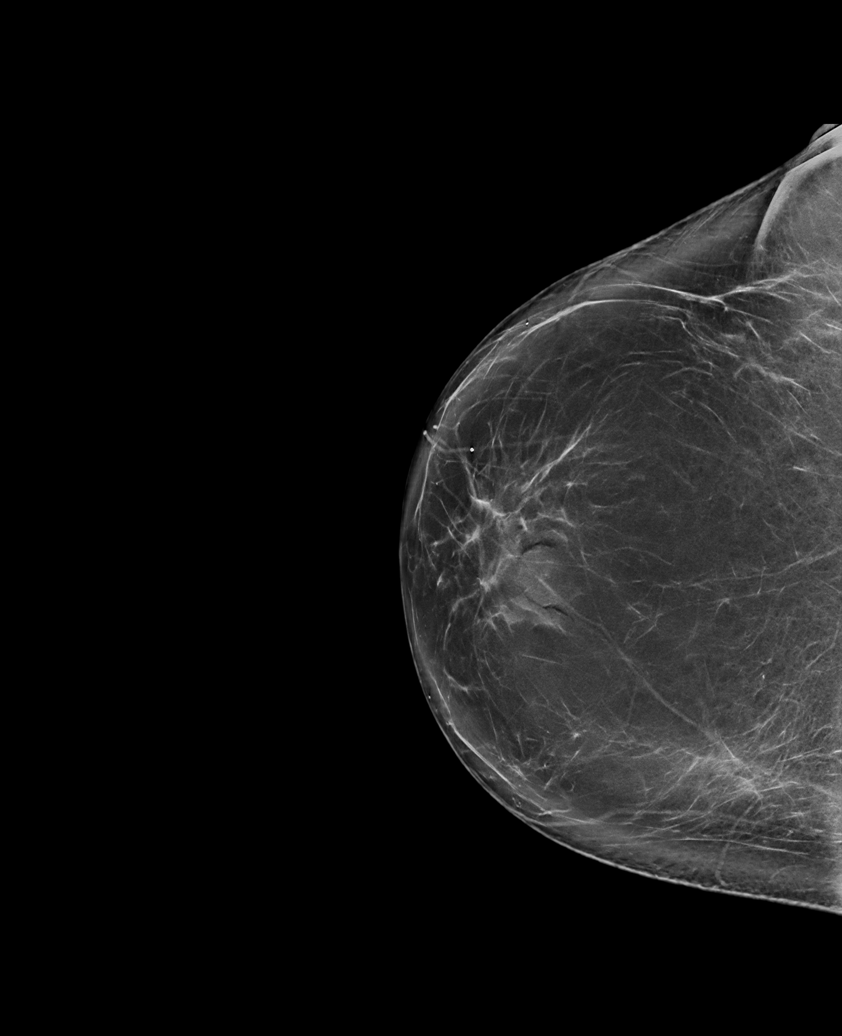

[L MLO synth-2D]
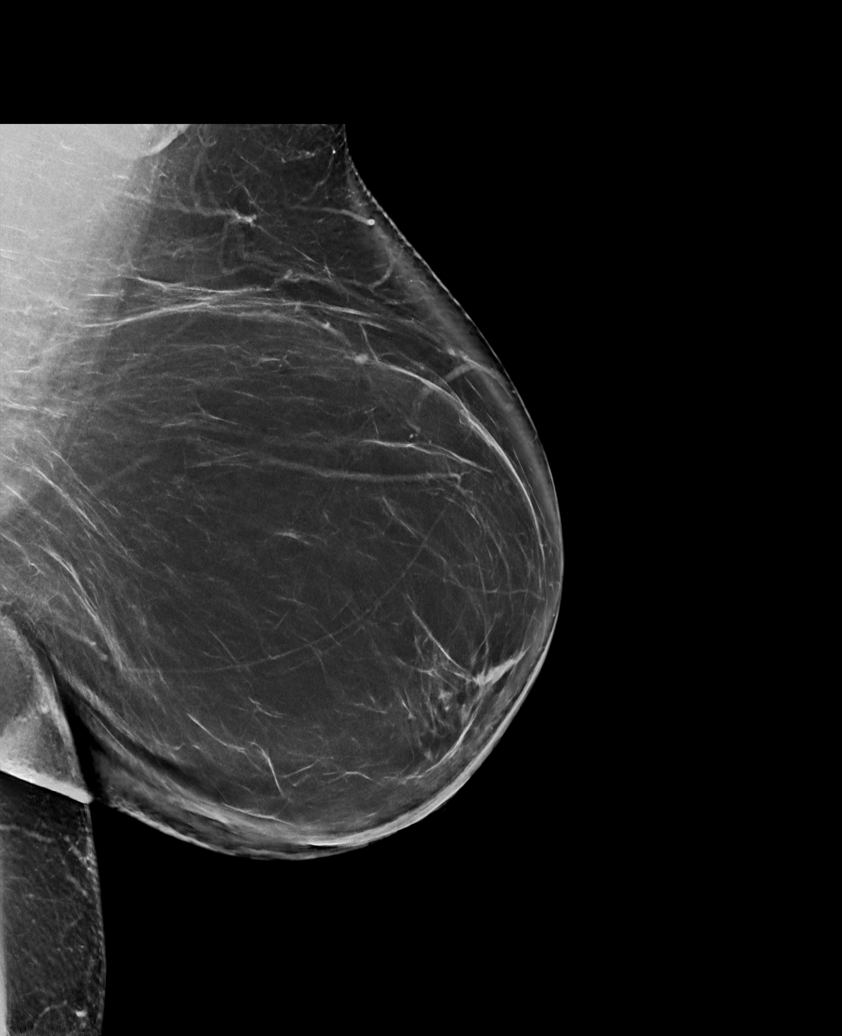

[R MLO synth-2D (2 of 2)]
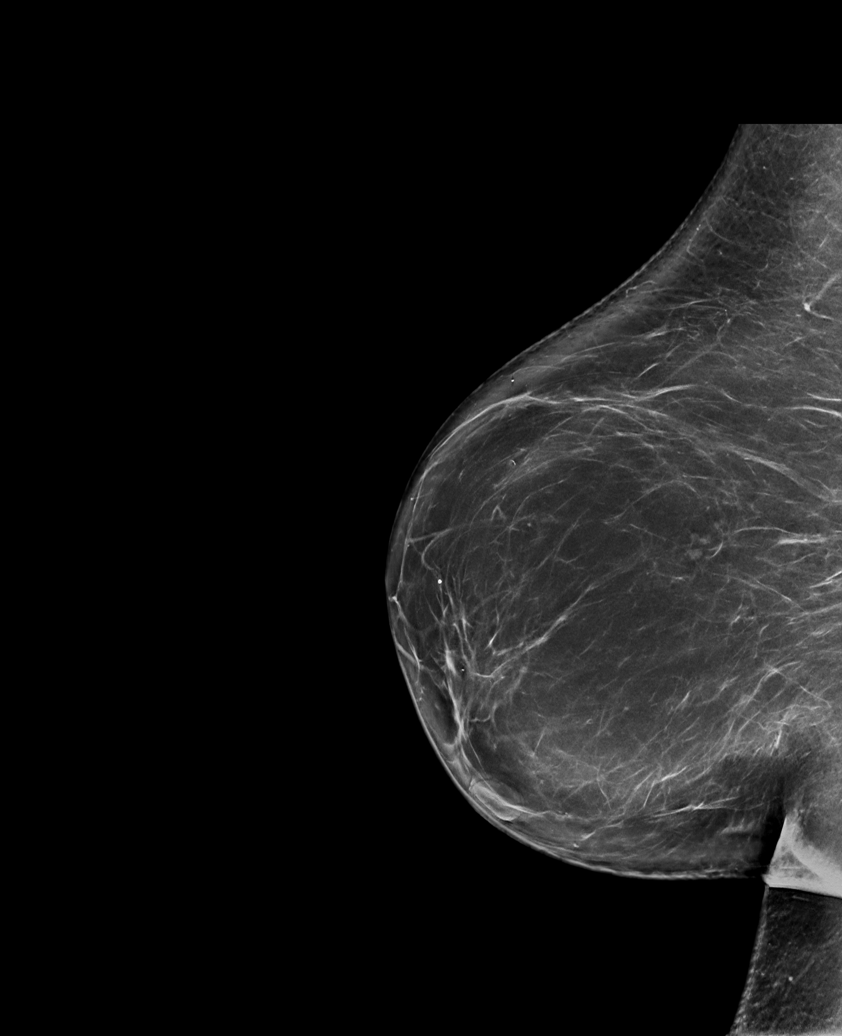

[L CC synth-2D]
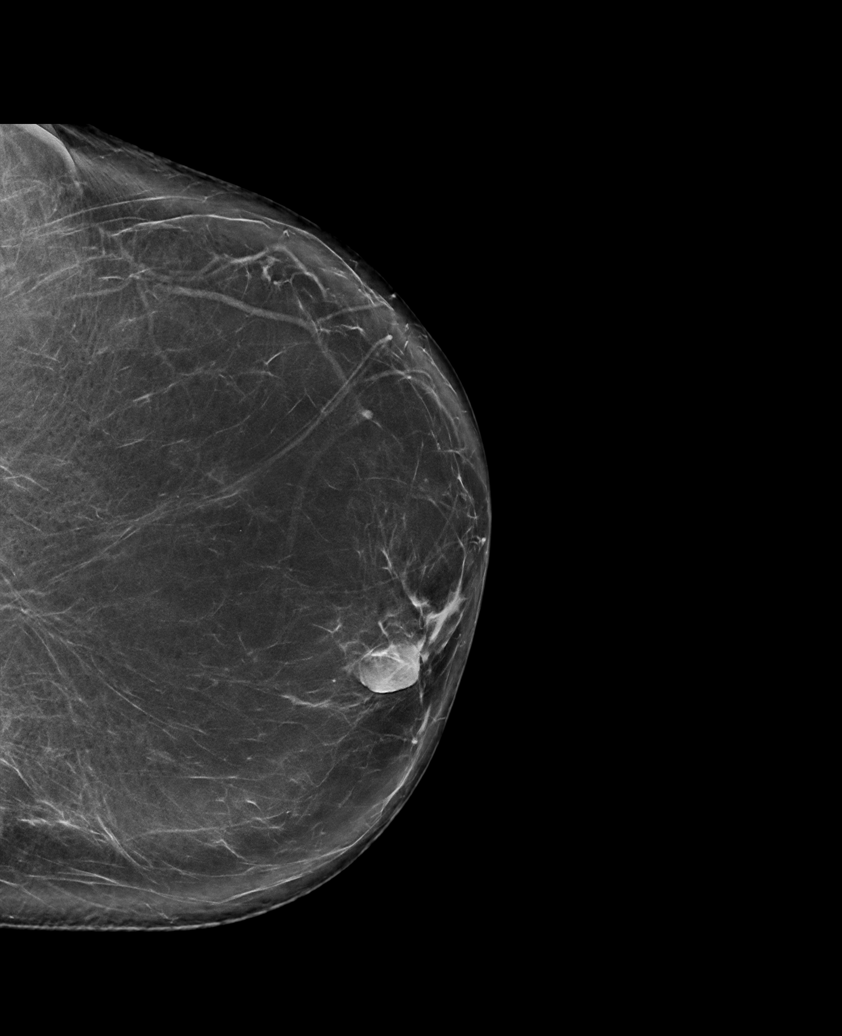

[L CC tomo · tomo slice 46/91.0]
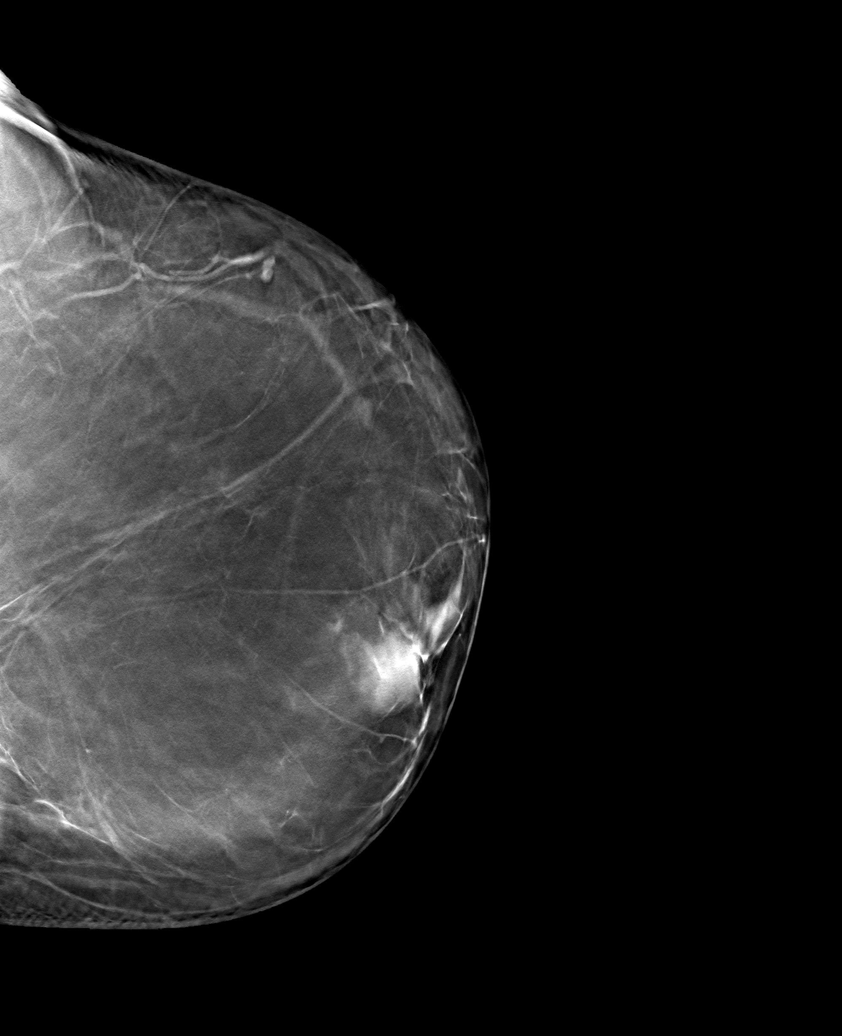

[6 of 30 positions shown; findings below may reference images not displayed]

ACR Breast Density Category b: There are scattered areas of
fibroglandular density.
FINDINGS: There are no findings suspicious for malignancy.
IMPRESSION: No mammographic evidence of malignancy. A result letter of this
screening mammogram will be mailed directly to the patient.

RECOMMENDATION:
Screening mammogram in one year. (Code:51-O-LD2)

BI-RADS CATEGORY  1: Negative.

## 2023-10-27 ENCOUNTER — Other Ambulatory Visit: Payer: Self-pay | Admitting: Family Medicine

## 2023-10-27 DIAGNOSIS — Z1231 Encounter for screening mammogram for malignant neoplasm of breast: Secondary | ICD-10-CM

## 2023-11-28 ENCOUNTER — Ambulatory Visit: Admitting: Podiatry

## 2023-11-28 ENCOUNTER — Ambulatory Visit

## 2023-11-28 DIAGNOSIS — L600 Ingrowing nail: Secondary | ICD-10-CM | POA: Diagnosis not present

## 2023-11-28 NOTE — Progress Notes (Signed)
   Chief Complaint  Patient presents with   Ingrown Toenail    Pt is here due to bilateral great toenail possible ingrown looking to have both removed.    Subjective: Patient presents today for evaluation of pain to the medial and lateral border of the bilateral great toes. Patient is concerned for possible ingrown nail.  It is very sensitive to touch.  Patient presents today for further treatment and evaluation.  No past medical history on file.  No past surgical history on file.  Not on File  Objective:  General: Well developed, nourished, in no acute distress, alert and oriented x3   Dermatology: Skin is warm, dry and supple bilateral.  Medial and lateral border bilateral great toes is tender with evidence of an ingrowing nail. Pain on palpation noted to the border of the nail fold.  Nail dystrophy also noted to the left great toe.  The remaining nails appear unremarkable at this time.   Vascular: DP and PT pulses palpable.  No clinical evidence of vascular compromise  Neruologic: Grossly intact via light touch bilateral.  Musculoskeletal: No pedal deformity noted  Assesement: #1 Paronychia with ingrowing nail medial and lateral border bilateral great toes  Plan of Care:  -Patient evaluated.  -Discussed treatment alternatives and plan of care. Explained nail avulsion procedure and post procedure course to patient. -Patient opted for permanent partial nail avulsion of the ingrown portion of the nail.  -Prior to procedure, local anesthesia infiltration utilized using 3 ml of a 50:50 mixture of 2% plain lidocaine and 0.5% plain marcaine in a normal hallux block fashion and a betadine prep performed.  -Partial permanent nail avulsion with chemical matrixectomy performed using 3x30sec applications of phenol followed by alcohol flush.  -Light dressing applied.  Post care instructions provided -Return to clinic 3 weeks  Thresa EMERSON Sar, DPM [facility]  Dr. Thresa EMERSON Sar, DPM    [address] [place], [place] [address]                Office [phone]  Fax [phone]

## 2023-11-28 NOTE — Patient Instructions (Signed)

## 2023-11-29 ENCOUNTER — Ambulatory Visit

## 2023-11-30 ENCOUNTER — Encounter: Payer: Self-pay | Admitting: Podiatry

## 2023-12-15 ENCOUNTER — Ambulatory Visit
Admission: RE | Admit: 2023-12-15 | Discharge: 2023-12-15 | Disposition: A | Discharge status: Home / Self Care | Source: Ambulatory Visit | Attending: Family Medicine | Admitting: Family Medicine

## 2023-12-15 DIAGNOSIS — Z1231 Encounter for screening mammogram for malignant neoplasm of breast: Secondary | ICD-10-CM

## 2023-12-19 ENCOUNTER — Ambulatory Visit: Admitting: Podiatry

## 2023-12-19 DIAGNOSIS — L600 Ingrowing nail: Secondary | ICD-10-CM

## 2023-12-19 NOTE — Progress Notes (Signed)
   Chief Complaint  Patient presents with   Ingrown Toenail    Pt is here to f/u on both great toenail after having ingrown removed, she states the right toe is tender to touch and has some redness, left toe is fine.    Subjective: 58 y.o. female presents today status post permanent nail avulsion procedure of the medial and lateral border bilateral great toes that was performed on 11/28/2023.  Doing well..   No past medical history on file.  Objective: Neurovascular status intact.  Skin is warm, dry and supple. Nail and respective nail fold appears to be healing appropriately.   Assessment: #1 s/p partial permanent nail matrixectomy medial and lateral border bilateral great toes.  11/28/2023   Plan of care: #1 patient was evaluated  #2 light debridement of the periungual debris was performed to the border of the respective toe and nail plate using a tissue nipper. #3 patient is to return to clinic on a PRN basis.   Thresa EMERSON Sar, DPM [facility]  Dr. Thresa EMERSON Sar, DPM    [address] [place], [place] [address]                Office [phone]  Fax [phone]
